# Patient Record
Sex: Female | Born: 1980 | Hispanic: No | Marital: Married
Health system: Southern US, Community
[De-identification: ages and names within clinical notes are randomized; demographics above are authoritative.]

## PROBLEM LIST (undated history)

## (undated) DIAGNOSIS — O3430 Maternal care for cervical incompetence, unspecified trimester: Secondary | ICD-10-CM

## (undated) HISTORY — PX: CERVICAL CERCLAGE: SHX1329

---

## 2013-05-11 ENCOUNTER — Inpatient Hospital Stay (HOSPITAL_COMMUNITY): Admission: AD | Admit: 2013-05-11 | Payer: Self-pay | Source: Ambulatory Visit | Admitting: Obstetrics and Gynecology

## 2013-05-18 ENCOUNTER — Other Ambulatory Visit: Payer: Self-pay

## 2013-07-13 ENCOUNTER — Ambulatory Visit (HOSPITAL_COMMUNITY): Admission: RE | Admit: 2013-07-13 | Payer: BC Managed Care – PPO | Source: Ambulatory Visit

## 2013-07-13 ENCOUNTER — Ambulatory Visit (HOSPITAL_COMMUNITY)
Admission: RE | Admit: 2013-07-13 | Discharge: 2013-07-13 | Disposition: A | Discharge status: Home / Self Care | Payer: BC Managed Care – PPO | Source: Ambulatory Visit | Attending: Obstetrics and Gynecology | Admitting: Obstetrics and Gynecology

## 2013-07-13 ENCOUNTER — Encounter (HOSPITAL_COMMUNITY): Payer: Self-pay

## 2013-07-13 ENCOUNTER — Other Ambulatory Visit (HOSPITAL_COMMUNITY): Payer: Self-pay | Admitting: Obstetrics and Gynecology

## 2013-07-13 DIAGNOSIS — Z3689 Encounter for other specified antenatal screening: Secondary | ICD-10-CM | POA: Insufficient documentation

## 2013-07-13 DIAGNOSIS — O26872 Cervical shortening, second trimester: Secondary | ICD-10-CM

## 2013-07-13 DIAGNOSIS — O26879 Cervical shortening, unspecified trimester: Secondary | ICD-10-CM | POA: Insufficient documentation

## 2013-07-13 IMAGING — US US OB DETAIL+14 WK
1 series · 12 of 28 positions shown · non-contrast
Comparison: none

[Series 1: us ob detail+14 wk · 0.22mm/px · 12 of 68 slices shown]
[im 3/68]
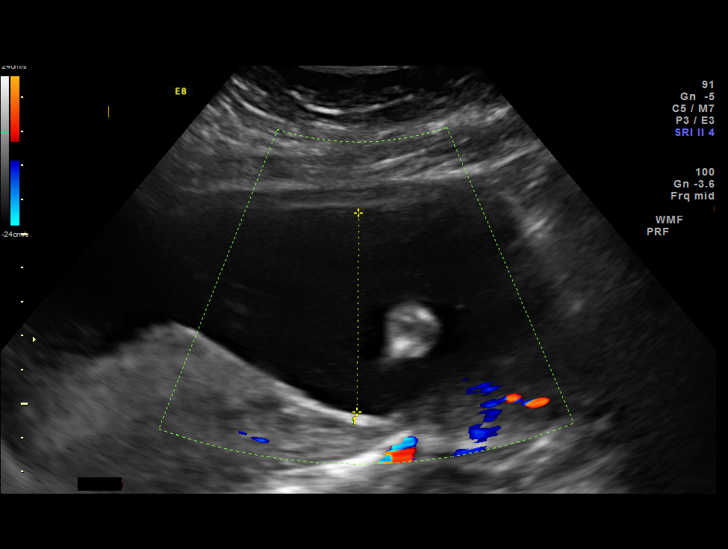
[im 8/68]
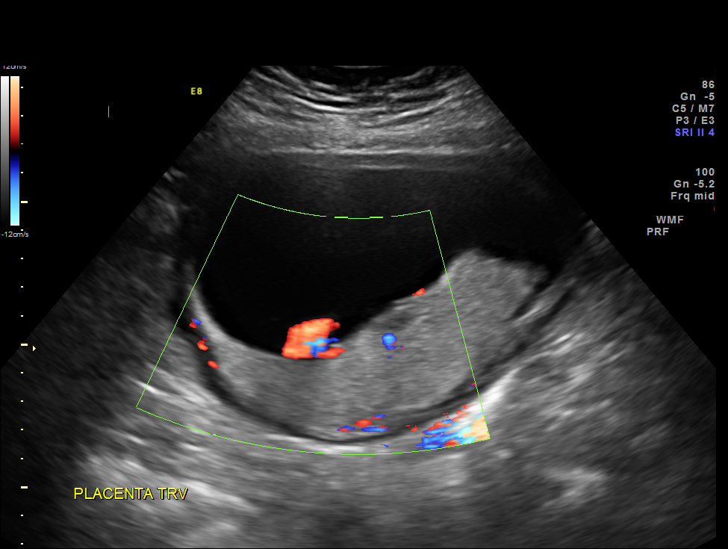
[im 13/68]
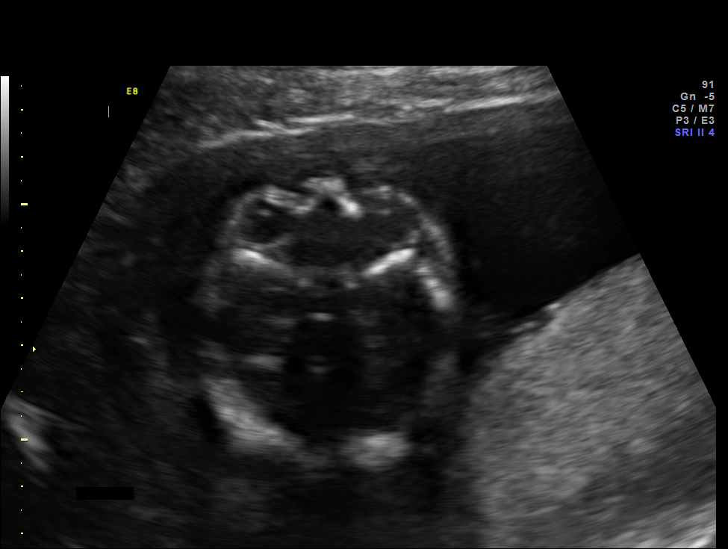
[im 20/68]
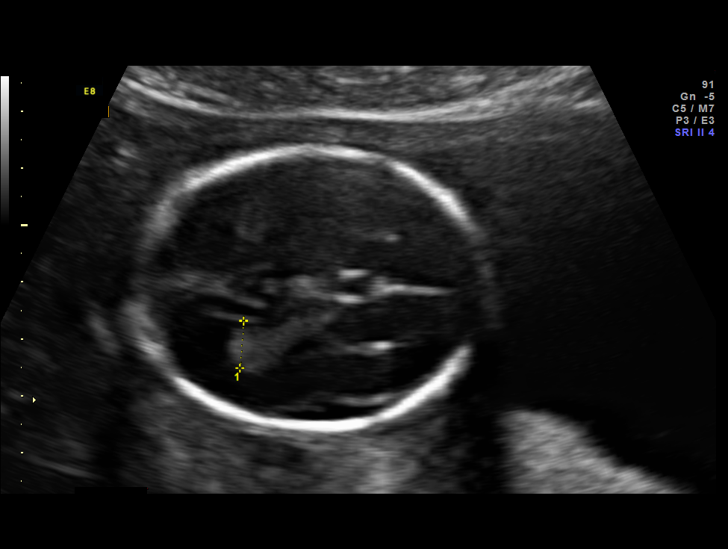
[im 25/68]
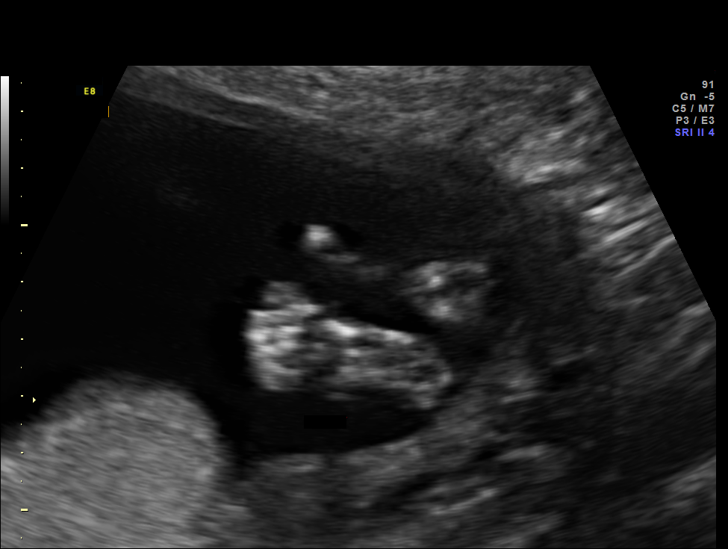
[im 30/68]
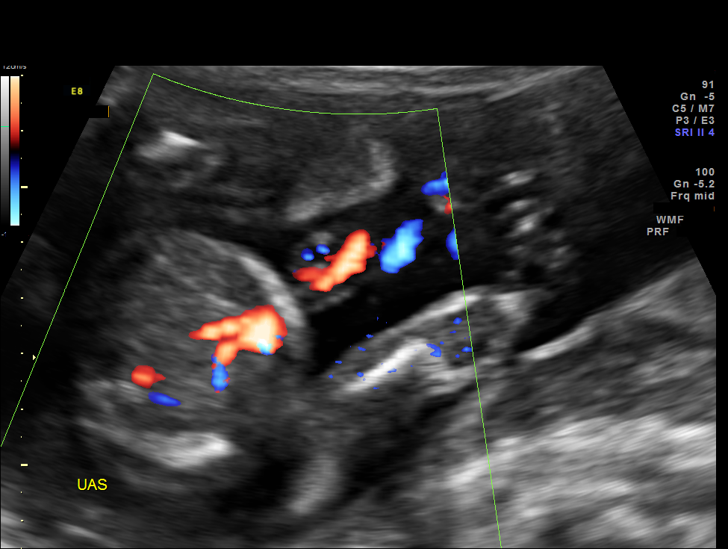
[im 38/68]
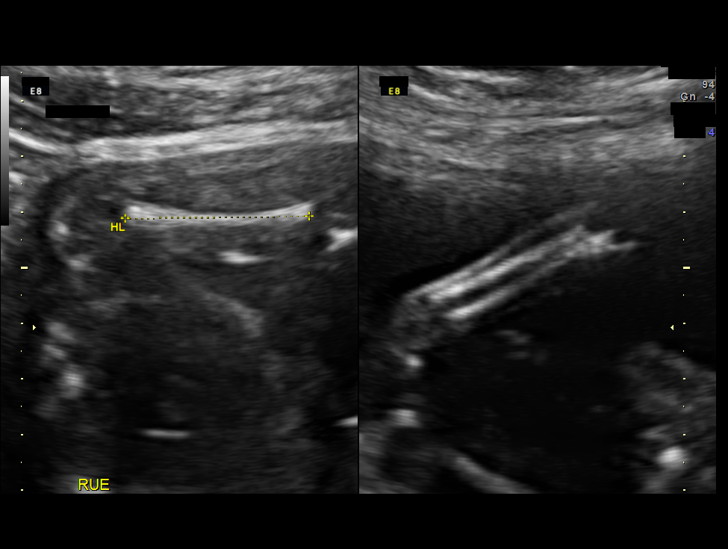
[im 43/68]
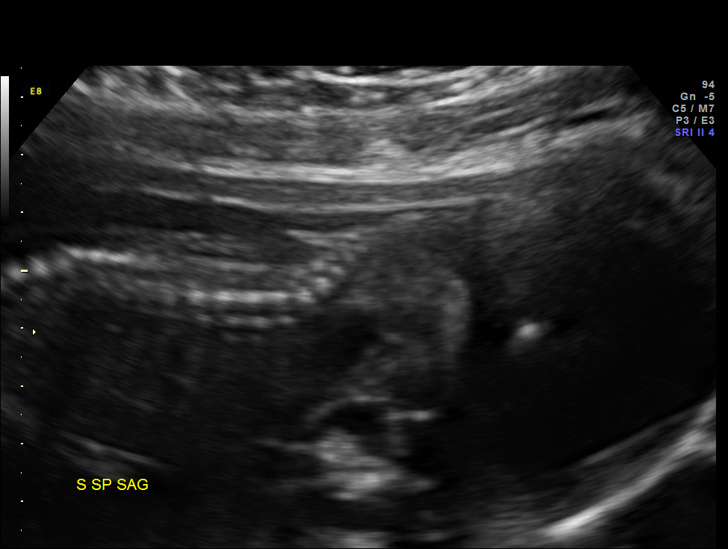
[im 48/68]
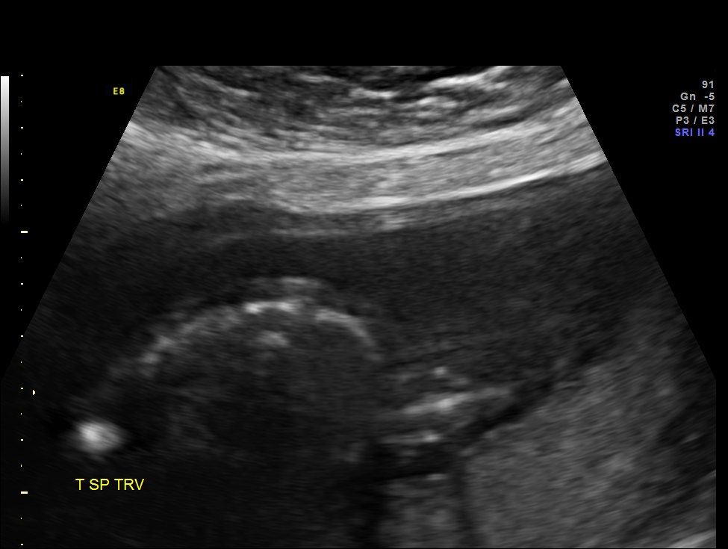
[im 55/68]
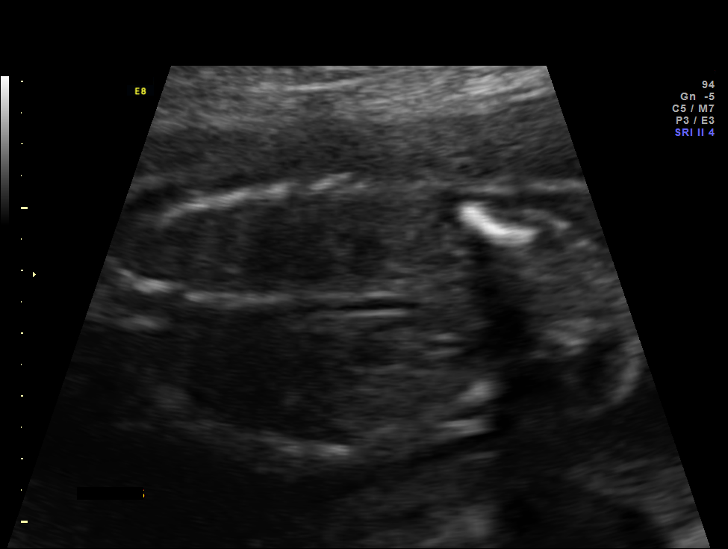
[im 60/68]
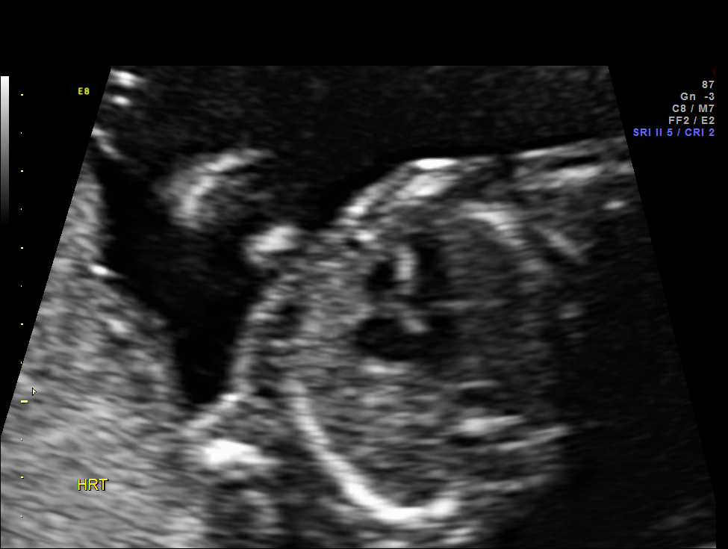
[im 65/68]
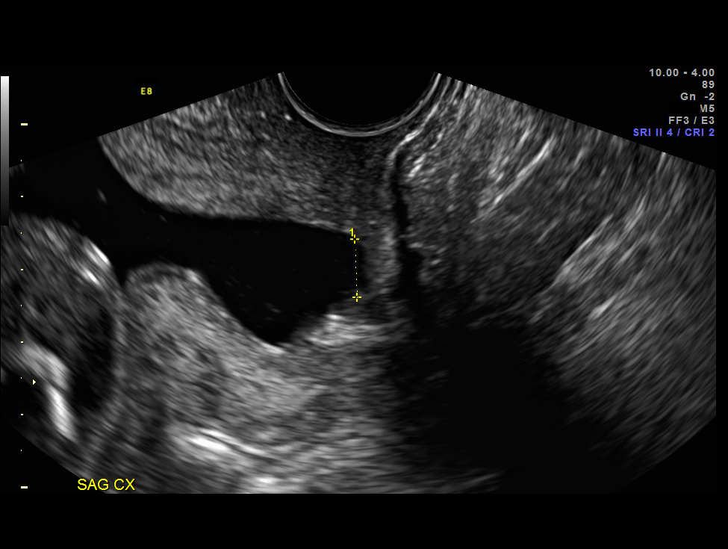

[12 of 28 positions shown; findings below may reference images not displayed]

OBSTETRICS REPORT
                      (Signed Final [DATE] [DATE])

Service(s) Provided

 US OB DETAIL + 14 WK                                  76811.0
 US OB TRANSVAGINAL                                    76817.0
Indications

 Detailed fetal anatomic survey                        655.83 [TC]
 Cervical shortening
Fetal Evaluation

 Num Of Fetuses:    1
 Fetal Heart Rate:  136                          bpm
 Cardiac Activity:  Observed
 Presentation:      Breech
 Placenta:          Posterior, above cervical
                    os
 P. Cord            Visualized, central
 Insertion:

 Amniotic Fluid
 AFI FV:      Subjectively within normal limits
                                             Larg Pckt:     5.8  cm
Biometry

 BPD:     49.4  mm     G. Age:  20w 6d                CI:         76.2   70 - 86
 OFD:     64.8  mm                                    FL/HC:      18.3   16.8 -

 HC:     183.2  mm     G. Age:  20w 5d       54  %    HC/AC:      1.13   1.09 -

 AC:     162.6  mm     G. Age:  21w 3d       73  %    FL/BPD:
 FL:      33.5  mm     G. Age:  20w 4d       44  %    FL/AC:      20.6   20 - 24
 HUM:     33.2  mm     G. Age:  21w 1d       71  %
 CER:     22.1  mm     G. Age:  21w 0d       60  %

 Est. FW:     387  gm    0 lb 14 oz      54  %
Gestational Age

 LMP:           20w 3d        Date:  [DATE]                 EDD:   [DATE]
 U/S Today:     20w 6d                                        EDD:   [DATE]
 Best:          20w 3d     Det. By:  LMP  ([DATE])          EDD:   [DATE]
Anatomy

 Cranium:          Appears normal         Aortic Arch:      Not well visualized
 Fetal Cavum:      Appears normal         Ductal Arch:      Not well visualized
 Ventricles:       Appears normal         Diaphragm:        Appears normal
 Choroid Plexus:   Appears normal         Stomach:          Appears normal
 Cerebellum:       Appears normal         Abdomen:          Appears normal
 Posterior Fossa:  Appears normal         Abdominal Wall:   Appears nml (cord
                                                            insert, abd wall)
 Nuchal Fold:      Not applicable (>20    Cord Vessels:     Appears normal (3
                   wks GA)                                  vessel cord)
 Face:             Appears normal         Kidneys:          Appear normal
                   (orbits and profile)
 Lips:             Appears normal         Bladder:          Appears normal
 Palate:           Not well visualized    Spine:            Appears normal
 Heart:            Not well visualized    Lower             Appears normal
                                          Extremities:
 RVOT:             Not well visualized    Upper             Appears normal
                                          Extremities:
 LVOT:             Not well visualized

 Other:  Fetus appears to be a male. Heels and 5th digit visualized. Nasal
         bone visualized. Technically difficult due to maternal habitus and fetal
         position.
Targeted Anatomy

 Fetal Central Nervous System
 Cisterna Magna:
Cervix Uterus Adnexa

 Cervix:       Appears funnelled, see comments.

 Left Ovary:    Not visualized.
 Right Ovary:   Within normal limits.

 Adnexa:     No abnormality visualized.
Comments

 Low risk first trimester screen.
Impression

 Active SIUP at [TC]
 EFW is at the 54th%'le
 No apparent structural defects or markers of aneuploidy
 Cardiac views are suboptimal warranting follow up attempt to
 complete our evaluation
 No previa
 Endovaginal imaging demonstrates a cervix with membranes
 hourglassing to the external os; ie, no functionally
 measurable cervix
 Hour-glassing membranes with internal sludge

 Today's findings and their implications of cervical
 insufficiency were discussed at bedside with your patient.  I
 discussed options for expectant management versus
 placement of rescue cerclage, detailing risks, benefits, and
 technical aspects.  I explained that cerclage placement would
 require a period of observation to rule out labor or evolving
 infection by inpatient admission.
Recommendations

 The patient elected for admission to [facility] [[facility]OS[facility]]
 under the care of the [facility]
 service.  Sterile speculum examination replete with wet
 mount, UA, urine culture, and [facility]/CT test will be required on
 admisssion.  She will have overnight uterine monitoring and
 will be NPO after midnight.  If labor is rule out and there is no
 evidence of evolving evidence of chorioamnionitis clinically,
 rescue cerclage placement will be arranged.

## 2013-07-14 ENCOUNTER — Encounter (HOSPITAL_COMMUNITY): Payer: BC Managed Care – PPO

## 2013-07-21 ENCOUNTER — Other Ambulatory Visit (HOSPITAL_COMMUNITY): Payer: Self-pay | Admitting: Obstetrics and Gynecology

## 2013-07-21 DIAGNOSIS — O26879 Cervical shortening, unspecified trimester: Secondary | ICD-10-CM

## 2013-07-22 ENCOUNTER — Ambulatory Visit (HOSPITAL_COMMUNITY)
Admission: RE | Admit: 2013-07-22 | Discharge: 2013-07-22 | Disposition: A | Discharge status: Home / Self Care | Payer: BC Managed Care – PPO | Source: Ambulatory Visit | Attending: Obstetrics and Gynecology | Admitting: Obstetrics and Gynecology

## 2013-07-22 VITALS — BP 113/63

## 2013-07-22 DIAGNOSIS — O26879 Cervical shortening, unspecified trimester: Secondary | ICD-10-CM

## 2013-07-22 IMAGING — US US OB TRANSVAGINAL
1 series · 13 of 23 positions shown · non-contrast
Comparison: none

[Series 1: us ob transvaginal · 0.23mm/px · 23 acquisitions, 13 frames shown]
[im 1/23]
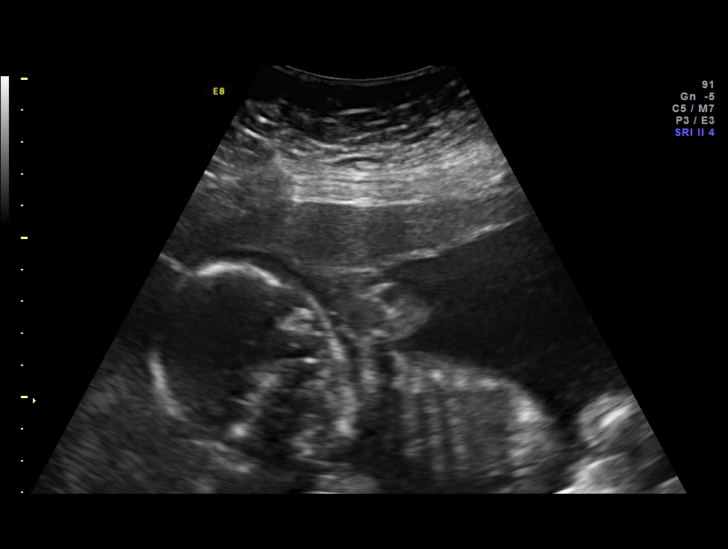
[im 3/23]
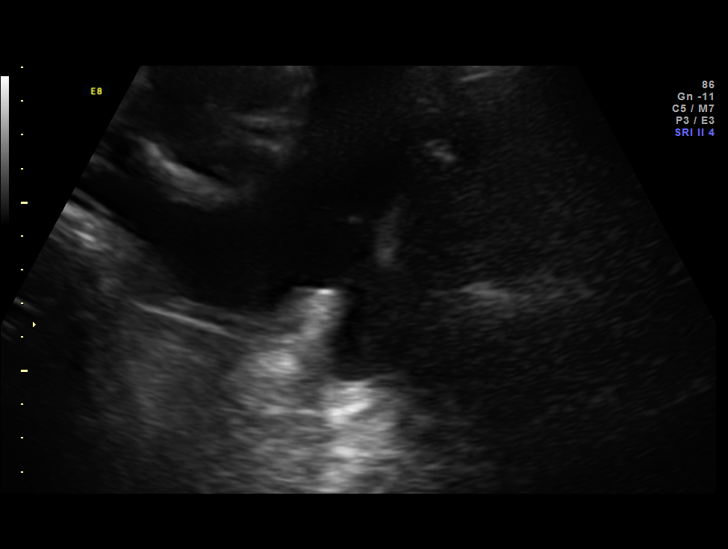
[im 5/23]
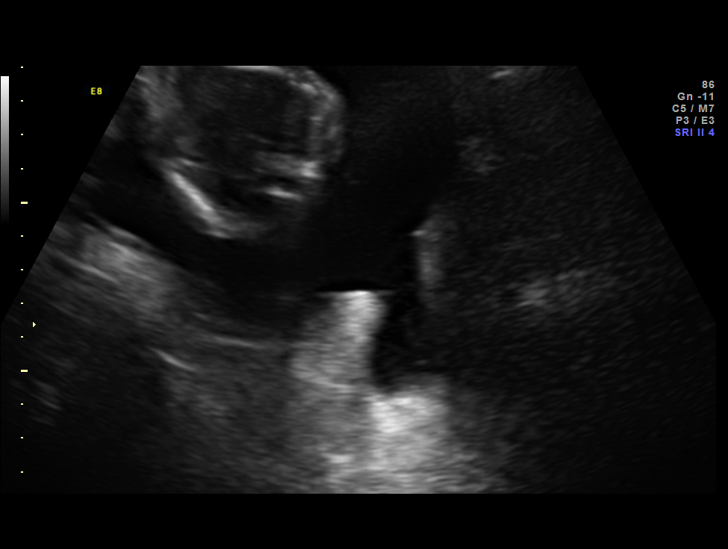
[im 7/23]
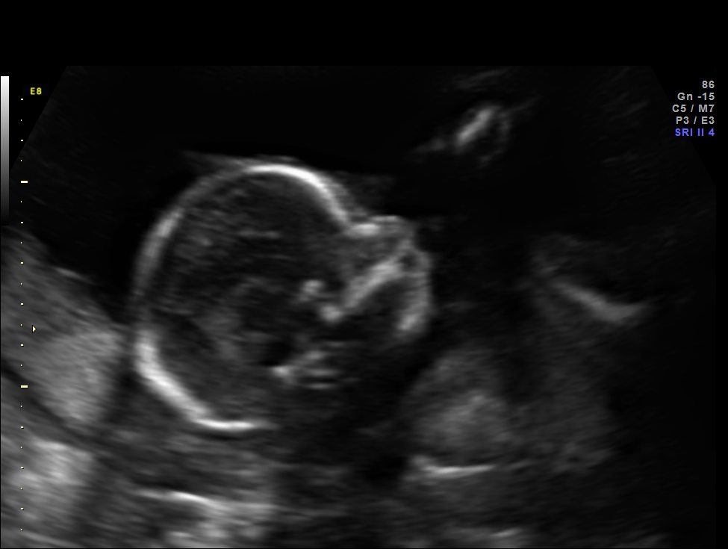
[im 8/23]
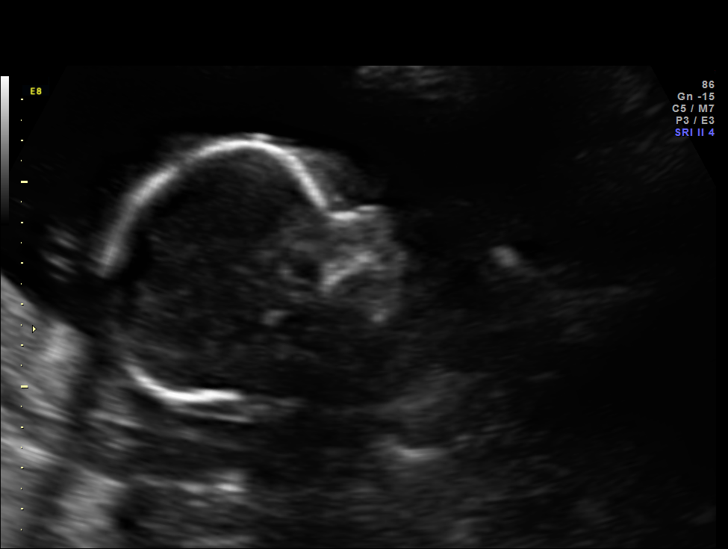
[im 10/23]
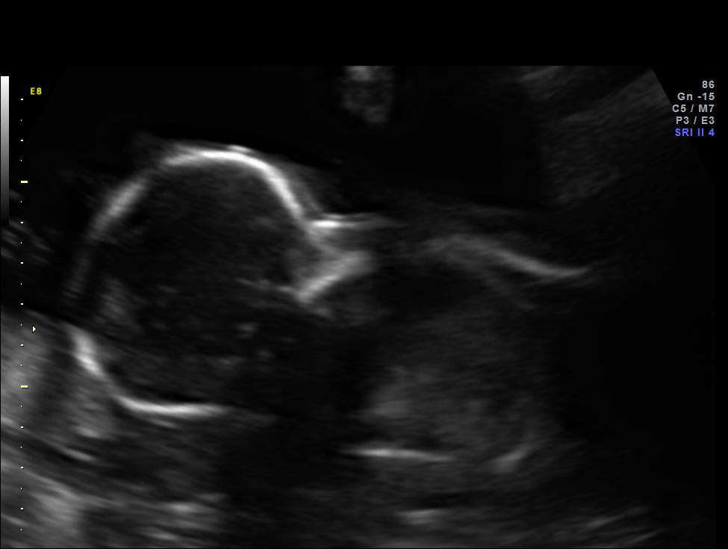
[im 12/23]
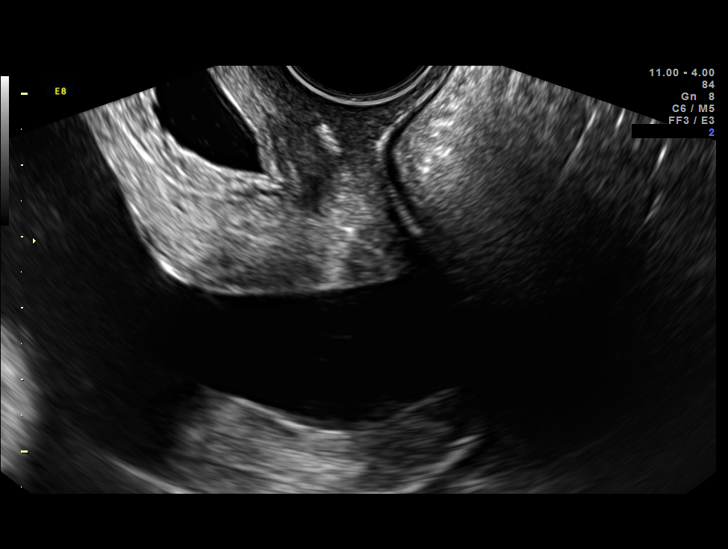
[im 14/23]
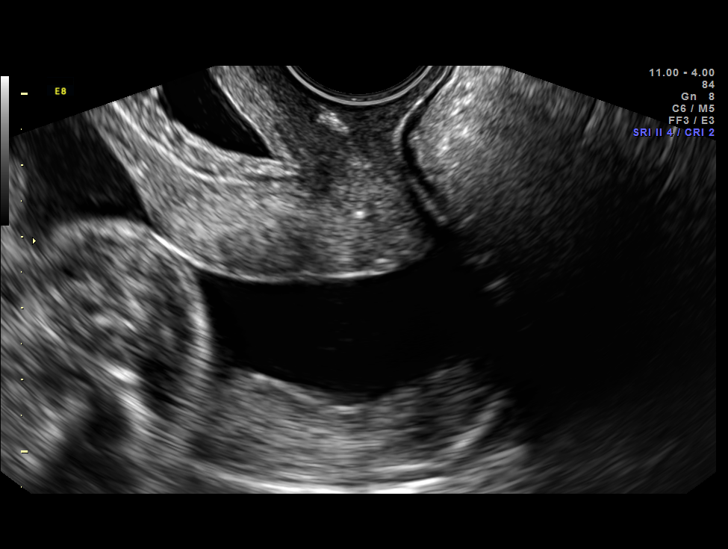
[im 16/23]
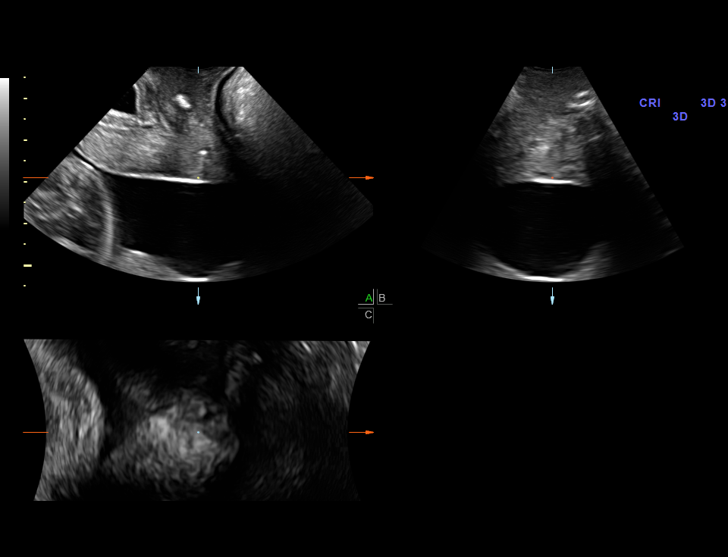
[im 17/23]
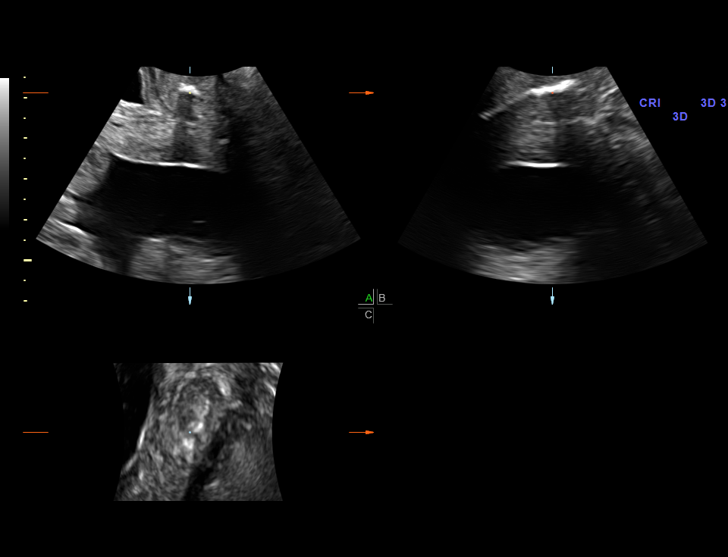
[im 19/23]
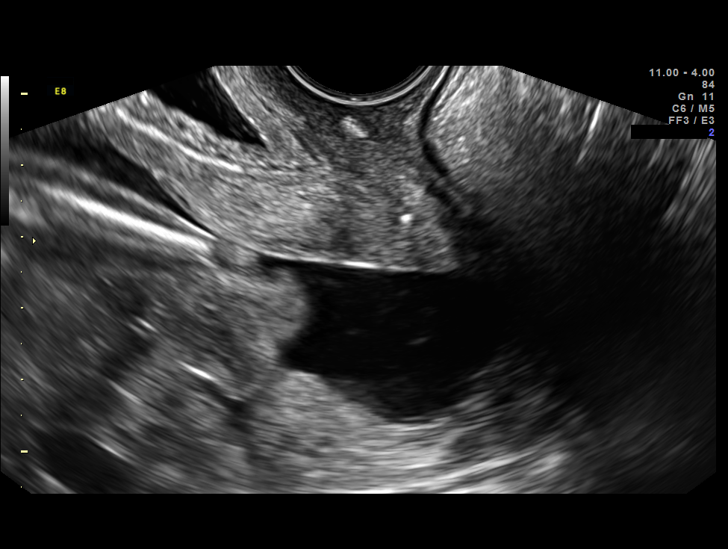
[im 21/23]
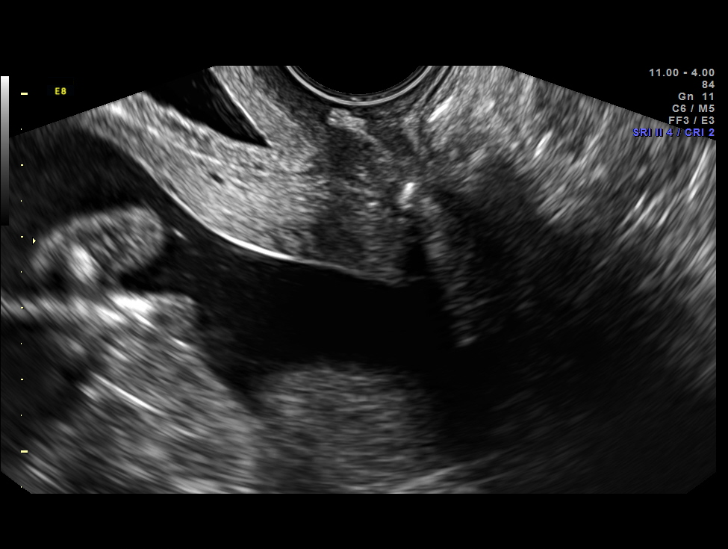
[im 23/23]
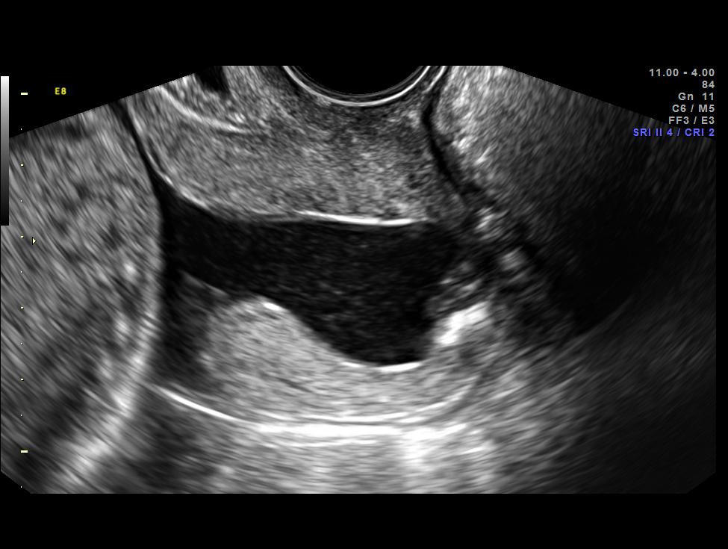

[13 of 23 positions shown; findings below may reference images not displayed]

OBSTETRICS REPORT
                      (Signed Final 07/22/2013 [DATE])

Service(s) Provided

 US OB TRANSVAGINAL                                    76817.0
Indications

 Cervical shortening
Fetal Evaluation

 Num Of Fetuses:    1
 Fetal Heart Rate:  141                          bpm
 Cardiac Activity:  Observed
 Presentation:      Frank breech
Gestational Age

 LMP:           21w 5d        Date:  02/20/13                 EDD:   11/27/13
 Best:          21w 5d     Det. By:  LMP  (02/20/13)          EDD:   11/27/13
Cervix Uterus Adnexa

 Cervix:       Cerclage visualized.
Comments

 Ms. Acab returns for follow up.  At last visit, she was noted
 to have membranes that were hour-glassing to the external
 os without any measureable cervix.  She underwent rescue
 cerclage at [facility] on [DATE].  She is currently
 without complaints - denies vaginal bleeding, leakage of fluid,
 abdominal pain or cramping.

 Transvaginal ultrasound reveals funneling to the level of the
 external os.  Some cervical debris is noted.  Speculum exam
 was performed that revealed membranes beyond the level of
 the cerclage stitch.

 The findings and limitations of the study were discussed with
 the patient.  Repeat cerclage appears to be of limited utility
 and may be associated with a worsening prognosis.  She is
 aware of the high risk of PROM and potential for preterm /
 previable delivery of the infant despite intervention.  Although
 of no proven benefit, cervical pessary has been reported to
 improve outcome in shortened cervix.  After counseling, the
 patient and her husband decided to be evaluated for a
 possible cervical pessary at the [facility] today.
Impression

 Single IUP at 21 [DATE] weeks
 TVUS : U-shaped funneling to the external os (no
 measureable cervix).  Some cervical debris noted.
 Membranes appear to be beyond the level of the cerclage
 stitch.
Recommendations

 Patient to be seen at the [facility] today.
 She was counseled to return to the hospital for evaluation
 should she develop vaginal bleeding or leakage of fluid.
 If she is deemed to be a candidate for cervical pessary -
 would remove in the event that she develops PROM or
 preterm labor.

 Would recommend course of Ramyrez at 23 weeks-
 would consider hospital admission, NICU consult at tha time.

## 2013-07-22 NOTE — Progress Notes (Signed)
Sara Arellano  was seen today for an ultrasound appointment.  See full report in [facility].  Comments: [name] returns for follow up.  At last visit, she was noted to have membranes that were hour-glassing to the external os without any measureable cervix.  She underwent rescue cerclage at [facility] on 9/11.  She is currently without complaints - denies vaginal bleeding, leakage of fluid, abdominal pain or cramping.  Transvaginal ultrasound reveals funneling to the level of the external os.  Some cervical debris is noted.  Speculum exam was performed that revealed membranes beyond the level of the cerclage stitch.    The findings and limitations of the study were discussed with the patient.  Repeat cerclage appears to be of limited utility and may be associated with a worsening prognosis.  She is aware of the high risk of PROM and potential for preterm / previable delivery of the infant despite intervention.  Although of no proven benefit, cervical pessary has been reported to improve outcome in shortened cervix.  After counseling, the patient and her husband decided to be evaluated for a possible cervical pessary at the [facility] today.  Impression: Single IUP at 21 5/7 weeks TVUS : U-shaped funneling to the external os (no measureable cervix).  Some cervical debris noted.  Membranes appear to be beyond the level of the cerclage stitch.  Recommendations: Patient to be seen at the [facility] today. She was counseled to return to the hospital for evaluation should she develop vaginal bleeding or leakage of fluid. If she is deemed to be a candidate for cervical pessary - would remove in the event that she develops PROM or preterm labor.  Would recommend course of betamethasone at 23 weeks- would consider hospital admission, NICU consult at tha time.  Alpha Gula, MD

## 2014-05-18 ENCOUNTER — Encounter (HOSPITAL_COMMUNITY): Payer: Self-pay | Admitting: *Deleted

## 2014-09-04 ENCOUNTER — Encounter (HOSPITAL_COMMUNITY): Payer: Self-pay | Admitting: *Deleted

## 2017-07-02 ENCOUNTER — Encounter (HOSPITAL_COMMUNITY): Payer: Self-pay

## 2017-07-02 ENCOUNTER — Ambulatory Visit (HOSPITAL_COMMUNITY)
Admission: RE | Admit: 2017-07-02 | Discharge: 2017-07-02 | Disposition: A | Discharge status: Home / Self Care | Payer: Managed Care, Other (non HMO) | Source: Ambulatory Visit | Attending: Maternal and Fetal Medicine | Admitting: Maternal and Fetal Medicine

## 2017-07-02 ENCOUNTER — Encounter (HOSPITAL_COMMUNITY): Payer: Self-pay | Admitting: *Deleted

## 2017-07-02 DIAGNOSIS — Q212 Atrioventricular septal defect: Secondary | ICD-10-CM | POA: Insufficient documentation

## 2017-07-02 DIAGNOSIS — IMO0002 Reserved for concepts with insufficient information to code with codable children: Secondary | ICD-10-CM

## 2017-07-02 HISTORY — DX: Maternal care for cervical incompetence, unspecified trimester: O34.30

## 2017-07-07 NOTE — Progress Notes (Signed)
Late Entry note for visit on 07/02/17  [place]  Patient Name: Sara Arellano Medical Record Number:  [id number] Date of Birth: 03-02-1981 Requesting Physician Name:  Jolyn Lent, MD Date of Service: 07/07/2017  Chief Complaint Fetal AV canal defect  History of Present Illness Pauleen Goleman is a 36 y.o. G2P0101,at 19w 4d with an EDD of 11/22/2017, whose fetus was confirmed to have an AV canal defect on fetal echo.  She is here to discuss pregnancy management options.  Review of Systems Pertinent items are noted in HPI.  Patient History OB History  Gravida Para Term Preterm AB Living  2 1 0 1 0 1  SAB TAB Ectopic Multiple Live Births  0 0 0 0      # Outcome Date GA Lbr Len/2nd Weight Sex Delivery Anes PTL Lv  2 Current           1 Preterm               Past Medical History:  Diagnosis Date  . Cervical insufficiency during pregnancy, antepartum     Past Surgical History:  Procedure Laterality Date  . CERVICAL CERCLAGE      Social History   Social History  . Marital status: Married    Spouse name: N/A  . Number of children: N/A  . Years of education: N/A   Social History Main Topics  . Smoking status: Not on file  . Smokeless tobacco: Not on file  . Alcohol use Not on file  . Drug use: Unknown  . Sexual activity: Not on file   Other Topics Concern  . Not on file   Social History Narrative  . No narrative on file    Physical Examination General appearance - alert, well appearing, and in no distress Mental status - alert, oriented to person, place, and time  Assessment and Recommendations 1.  Fetal AV canal defect.  Ms. Scaff has met with Dr. Volanda Napoleon after her fetal echo and had a lengthy discussion with him about prognosis and likely quality of life for her child after repair in infancy.  She understands that the prognosis is overall good for fetuses with this defect without any genetic abnormalities, and that they typically  have a normal quality of life provided they are not born prematurely which notably worsens prognosis.  We also discussed the possibility of amniocentesis to confirm a normal karyotytpe, which we expect to be normal based on normal cell-free DNA testing.  However, Ms. Cianci is certain she wishes to proceed with termination of pregnancy even in the case of a normal karyotype, making amniocentesis unnecessary at this time.  We discussed the risks and benefits of termination of pregnancy via induction of labor or D&E.  Ms. Gagen firmly wants a D&E.  I provided her with the information required for informed consent and she completed the required paperwork today 07/02/17 at 3:15pm.  I spent 90 minutes with Ms. Peavler today of which 50% was face-to-face counseling.  Thank you for referring Ms. Richoux to the [facility].  Please do not hesitate to contact us with questions.   Jolyn Lent, MD

## 2017-07-14 ENCOUNTER — Encounter (HOSPITAL_COMMUNITY): Payer: Self-pay

## 2017-07-14 ENCOUNTER — Other Ambulatory Visit: Payer: Self-pay

## 2018-02-14 ENCOUNTER — Encounter (HOSPITAL_COMMUNITY): Payer: Self-pay
